# Patient Record
Sex: Female | Born: 1950 | Race: Black or African American | Hispanic: No | Marital: Single | Smoking: Never smoker
Health system: Southern US, Community
[De-identification: ages and names within clinical notes are randomized; demographics above are authoritative.]

## PROBLEM LIST (undated history)

## (undated) DIAGNOSIS — I82409 Acute embolism and thrombosis of unspecified deep veins of unspecified lower extremity: Secondary | ICD-10-CM

## (undated) DIAGNOSIS — I2699 Other pulmonary embolism without acute cor pulmonale: Secondary | ICD-10-CM

## (undated) DIAGNOSIS — Z21 Asymptomatic human immunodeficiency virus [HIV] infection status: Secondary | ICD-10-CM

## (undated) HISTORY — PX: ABDOMINAL HYSTERECTOMY: SHX81

## (undated) HISTORY — PX: SKIN BIOPSY: SHX1

## (undated) HISTORY — PX: HERNIA REPAIR: SHX51

## (undated) HISTORY — PX: KNEE SURGERY: SHX244

## (undated) HISTORY — PX: BREAST BIOPSY: SHX20

---

## 2008-09-09 ENCOUNTER — Inpatient Hospital Stay (HOSPITAL_COMMUNITY): Admission: EM | Admit: 2008-09-09 | Discharge: 2008-09-10 | Payer: Self-pay | Admitting: Internal Medicine

## 2008-09-09 ENCOUNTER — Ambulatory Visit: Payer: Self-pay | Admitting: Interventional Radiology

## 2008-09-09 ENCOUNTER — Encounter: Payer: Self-pay | Admitting: Emergency Medicine

## 2010-07-04 IMAGING — CT CT HEAD W/O CM
1 series · 16 of 30 positions shown, 20 images · non-contrast
Comparison: None

CLINICAL DATA: Weakness

CT HEAD WITHOUT CONTRAST
TECHNIQUE: Contiguous axial images were obtained from the base of
the skull through the vertex without contrast.

[Series 2: head 4.8 h37s · axial · 0.46mm/px · z∈[-144,-11]mm · 16 of 32 slices shown, 20 images]
[im 2/32  brain]
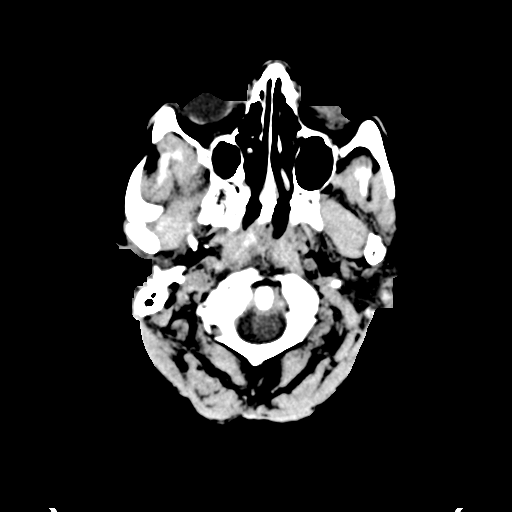
[im 2/32  bone]
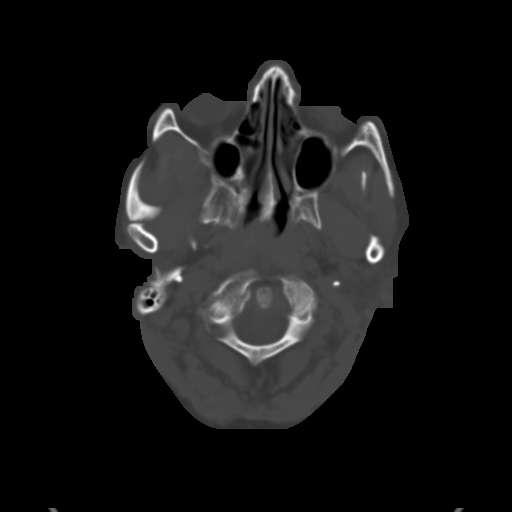
[im 4/32  brain]
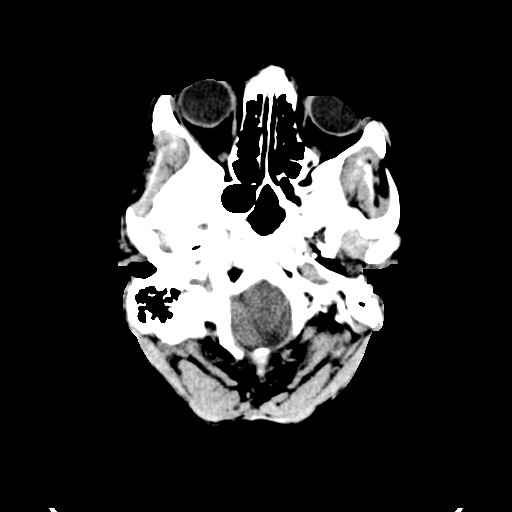
[im 6/32  brain]
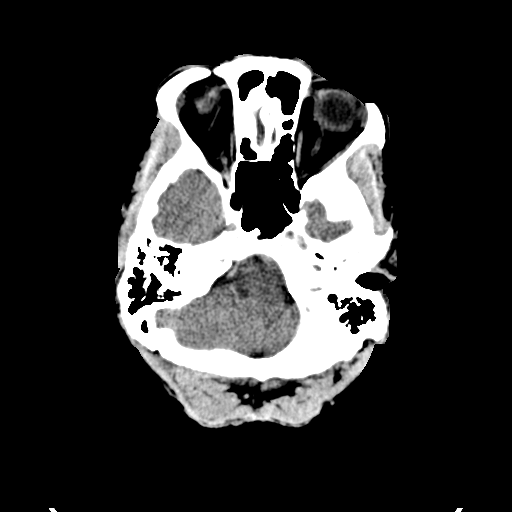
[im 8/32  brain]
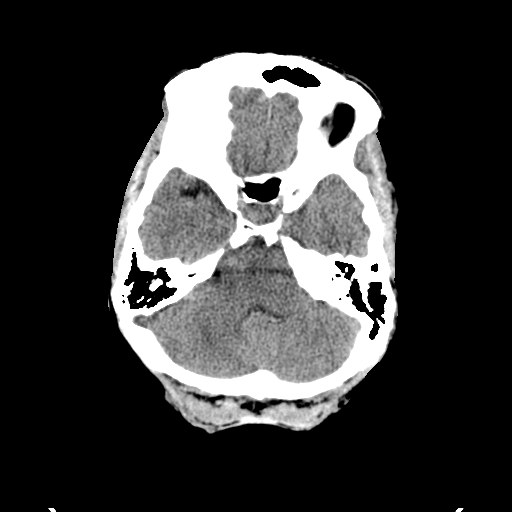
[im 9/32  brain]
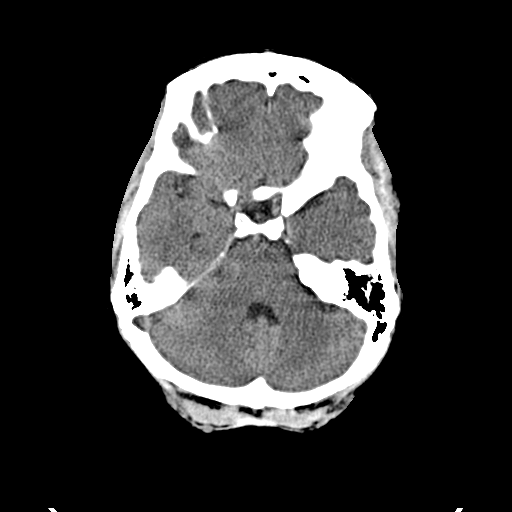
[im 9/32  bone]
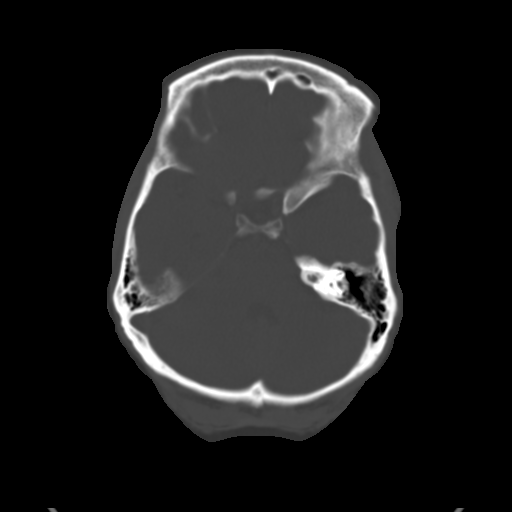
[im 11/32  brain]
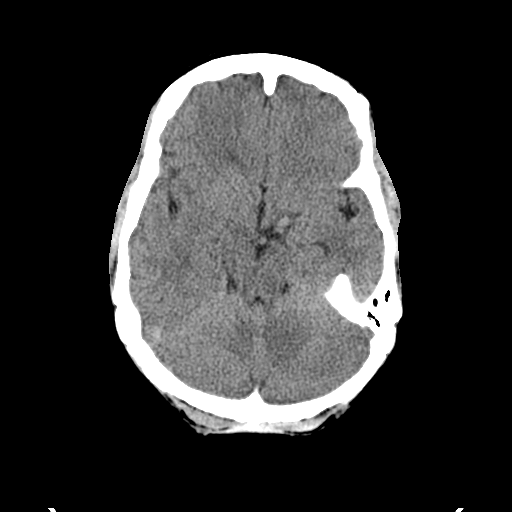
[im 13/32  brain]
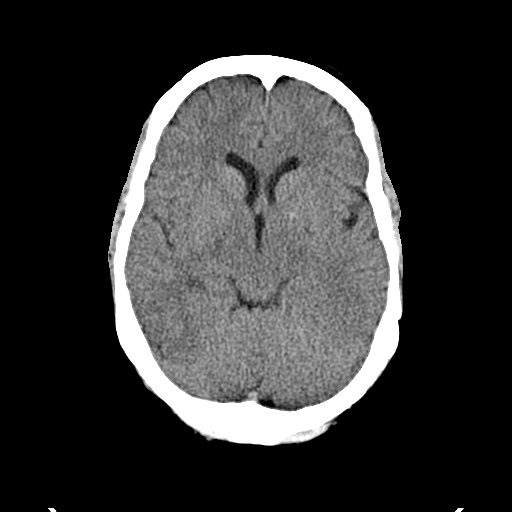
[im 15/32  brain]
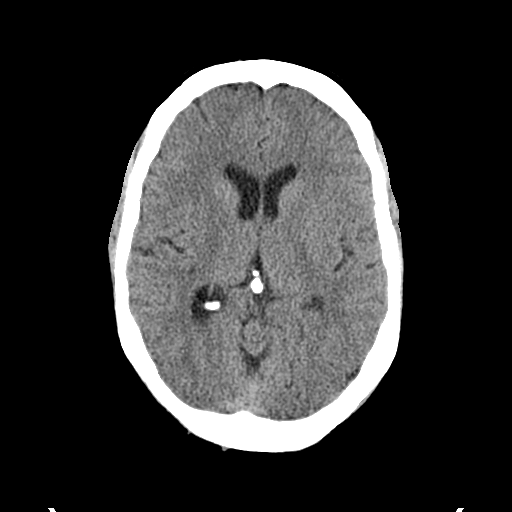
[im 17/32  brain]
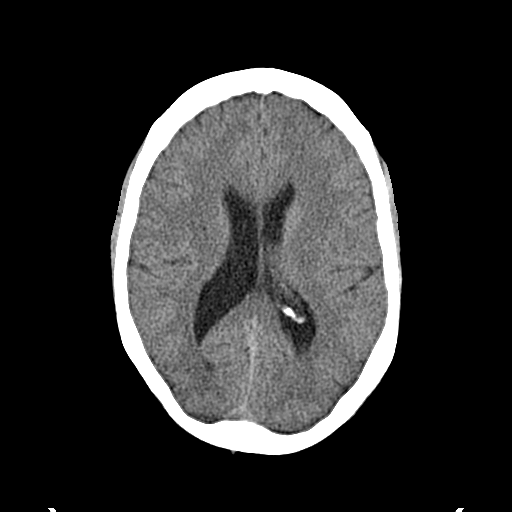
[im 17/32  bone]
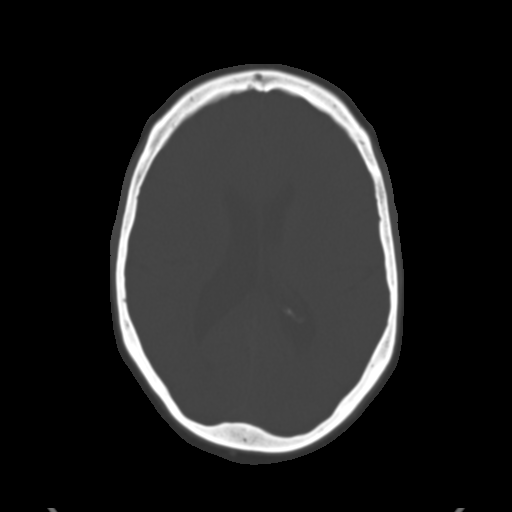
[im 19/32  brain]
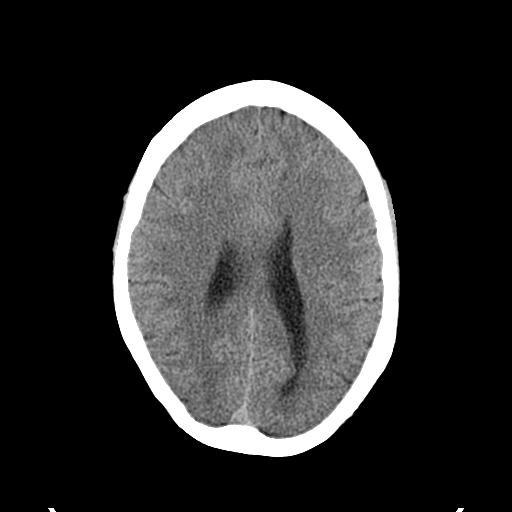
[im 21/32  brain]
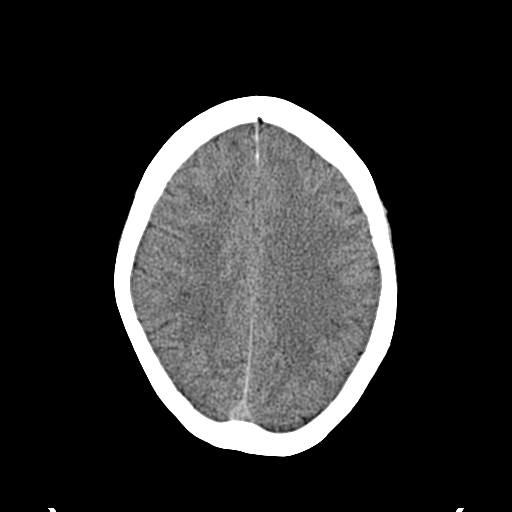
[im 23/32  brain]
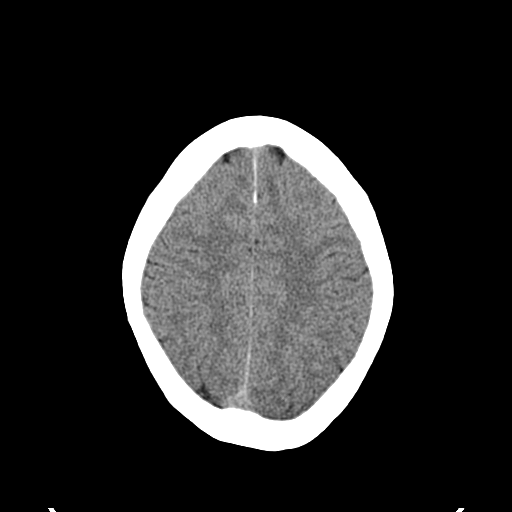
[im 24/32  brain]
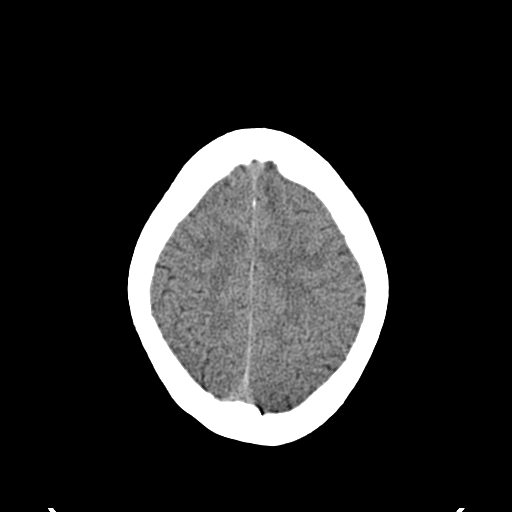
[im 24/32  bone]
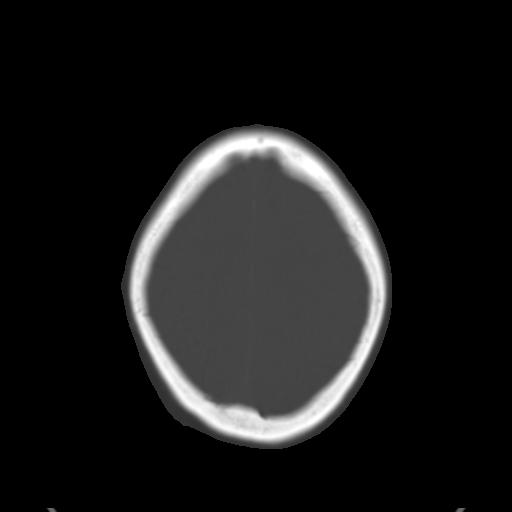
[im 26/32  brain]
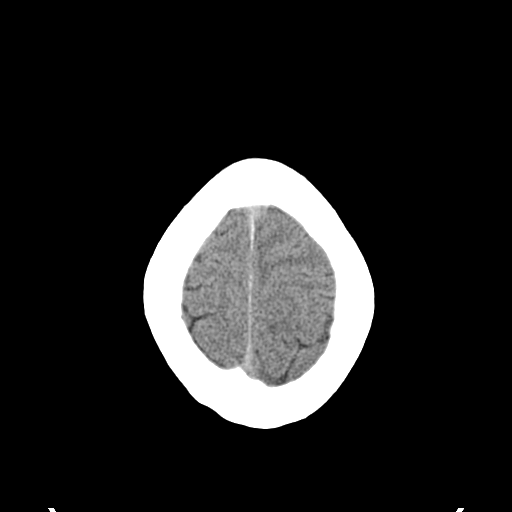
[im 28/32  brain]
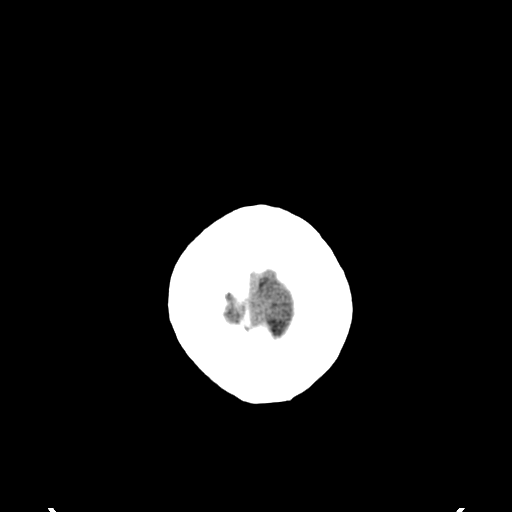
[im 30/32  brain]
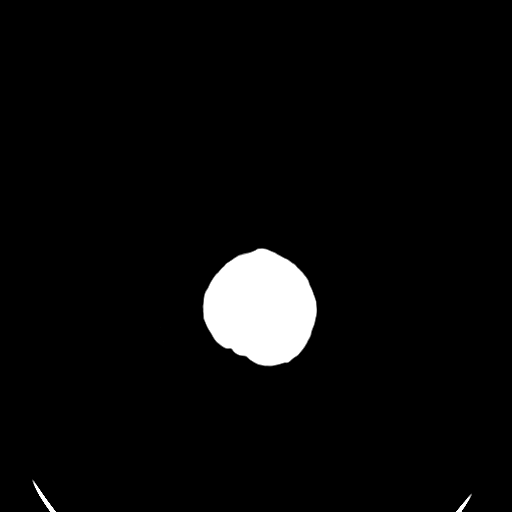

[16 of 30 positions shown; findings below may reference images not displayed]

FINDINGS: There is no mass effect, midline shift, or acute
intracranial hemorrhage.  Calcifications in the falx are noted
anteriorly.  The ventricular system and extraaxial space are
unremarkable.  Little if any chronic ischemic changes are noted in
the periventricular white matter.Mastoid air cells and visualized
paranasal sinuses are clear.
IMPRESSION: No acute intracranial pathology.

## 2010-09-03 LAB — CBC
HCT: 36.9 % (ref 36.0–46.0)
Hemoglobin: 12.8 g/dL (ref 12.0–15.0)
MCHC: 33.5 g/dL (ref 30.0–36.0)
MCHC: 33.6 g/dL (ref 30.0–36.0)
MCV: 92.2 fL (ref 78.0–100.0)
Platelets: 186 10*3/uL (ref 150–400)
RBC: 4.14 MIL/uL (ref 3.87–5.11)
RDW: 12.3 % (ref 11.5–15.5)
RDW: 13.4 % (ref 11.5–15.5)

## 2010-09-03 LAB — PHOSPHORUS: Phosphorus: 2.5 mg/dL (ref 2.3–4.6)

## 2010-09-03 LAB — BASIC METABOLIC PANEL
BUN: 5 mg/dL — ABNORMAL LOW (ref 6–23)
BUN: 7 mg/dL (ref 6–23)
CO2: 28 mEq/L (ref 19–32)
CO2: 33 mEq/L — ABNORMAL HIGH (ref 19–32)
Calcium: 8.9 mg/dL (ref 8.4–10.5)
Chloride: 107 mEq/L (ref 96–112)
Chloride: 109 mEq/L (ref 96–112)
Creatinine, Ser: 1 mg/dL (ref 0.4–1.2)
GFR calc Af Amer: >60 mL/min (ref >60)
GFR calc non Af Amer: 57 mL/min — ABNORMAL LOW (ref >60)
GFR calc non Af Amer: 58 mL/min — ABNORMAL LOW (ref >60)
GFR calc non Af Amer: >60 mL/min (ref >60)
Glucose, Bld: 120 mg/dL — ABNORMAL HIGH (ref 70–99)
Glucose, Bld: 123 mg/dL — ABNORMAL HIGH (ref 70–99)
Glucose, Bld: 80 mg/dL (ref 70–99)
Potassium: 3 mEq/L — ABNORMAL LOW (ref 3.5–5.1)
Potassium: 3.7 mEq/L (ref 3.5–5.1)
Sodium: 141 mEq/L (ref 135–145)
Sodium: 144 mEq/L (ref 135–145)

## 2010-09-03 LAB — POCT CARDIAC MARKERS
CKMB, poc: <1 ng/mL — ABNORMAL LOW (ref 1.0–8.0)
Myoglobin, poc: 39.3 ng/mL (ref 12–200)
Troponin i, poc: <0.05 ng/mL (ref 0.00–0.09)

## 2010-09-03 LAB — CARDIAC PANEL(CRET KIN+CKTOT+MB+TROPI)
CK, MB: 0.5 ng/mL (ref 0.3–4.0)
Total CK: 55 U/L (ref 7–177)
Total CK: 60 U/L (ref 7–177)

## 2010-09-03 LAB — DIFFERENTIAL
Basophils Absolute: 0 10*3/uL (ref 0.0–0.1)
Basophils Relative: 1 % (ref 0–1)
Lymphocytes Relative: 34 % (ref 12–46)
Monocytes Absolute: 0.3 10*3/uL (ref 0.1–1.0)
Neutro Abs: 2.8 10*3/uL (ref 1.7–7.7)
Neutrophils Relative %: 57 % (ref 43–77)

## 2010-10-07 NOTE — Discharge Summary (Signed)
NAME:  Marie Hopkins, Marie Hopkins            ACCOUNT NO.:  [id number]   MEDICAL RECORD NO.:  [id number]          PATIENT TYPE:  INP   LOCATION:  6741                         FACILITY:  [facility]   PHYSICIAN:  Peggye Pitt, M.D. DATE OF BIRTH:  10-Apr-1951   DATE OF ADMISSION:  09/09/2008  DATE OF DISCHARGE:  09/10/2008                               DISCHARGE SUMMARY   DISCHARGE DIAGNOSES:  1. Syncope, likely orthostasis versus vasovagal syncope.  2. Right lower lobe pneumonia.  3. Human immunodeficiency virus with a CD-4 count of 700 and a viral      load that is undetectable.  4. Hypokalemia, resolved.   DISCHARGE MEDICATIONS:  1. Avelox 400 mg daily for 7 days.  2. Lexiva 700 mg 2 tablets daily.  3. Norvir 100 mg daily.  4. Truvada 1 tablet daily.  5. Lasix 20 mg daily.  6. Premarin 1.25 mg daily.  7. Wellbutrin XL 300 mg daily.  8. Prilosec 20 mg daily.  9. Multivitamin 1 tablet daily.  10.Fosamax 70 mg weekly.   DISPOSITION AND FOLLOWUP:  Marie Hopkins is discharged home in stable  condition.  She has a pneumonia and will need to be on Avelox for a  total of 7 days.  I have recommended that she follow up with her primary  care physician in about 4-6 weeks.  At that time, a chest x-ray should  be done to follow up on the resolution of her pneumonia.   CONSULTATION THIS HOSPITALIZATION:  None.   IMAGES AND PROCEDURES PERFORMED DURING THIS HOSPITALIZATION:  1. Chest x-ray on September 09, 2008, that showed a right lower lobe      airspace disease.  2. CT scan of the head without contrast on September 09, 2008, that showed      no acute intracranial pathology.   HISTORY AND PHYSICAL EXAMINATION:  For full details, please refer to  dictation by Dr. Daiva Eves on September 09, 2008, but in brief, Marie Hopkins  is a very pleasant 60 year old African American woman who has a past  medical history significant for HIV that was diagnosed in 1998 with a  most recent CD-4 count of about 700.  She has  been maintained on HAART  therapy since diagnosis.  She stated that on day of admission, she was  at church, stood up, and began singing, and after a few minutes felt  like something was not right.  She denied any dizziness or  lightheadedness but sat down.  However, a few minutes later when she got  up to leave the church, she felt lightheaded, dizzy, and passed out  having been caught by her partner.  She was then taken to the [facility] and then transferred to [facility] for further evaluation and  management.  Of note upon admission, she was found to be hypokalemic  with a potassium of 2.9.   HOSPITAL COURSE BY ACTIVE PROBLEM:  1. Syncopal event:  Initial scan of the head was negative.  Given that      she had no focal neurologic findings, I believe that an MRI is not  necessary at this point.  I believe that this episode of syncope      was likely orthostatic or vasovagal.  Despite the fact that she was      not orthostatic by vital signs and fact that she becomes dizzy upon      standing, makes me more incline to this diagnosis.  She has had a      negative cardiac workup including 2 sets of negative cardiac      enzymes and an EKG that shows no acute ST-T wave changes.  There is      also a possibility that the syncopal event may have been      precipitated by her pneumonia and some slight dehydration because      of that.  2. Right lower lobe pneumonia:  A culture data is still pending at the      time of this dictation.  She will be discharged on Avelox 400 mg      daily for a total of 7 days.  3. Human immunodeficiency virus:  She is to continue her HAART      therapy.  4. Her hypokalemia has been repleted.  Her magnesium level was normal.  5. Rest of chronic medical issues have not been a problem on this      hospitalization.   VITAL SIGNS UPON DISCHARGE:  Blood pressure 105/68, heart rate 61,  respirations 18, O2 sats 97% on room air with a temperature of  97.9.   LABORATORY DATA ON DAY OF DISCHARGE:  Sodium 141, potassium 3.7,  chloride 109, bicarb 28, BUN 5, creatinine 0.99 with a glucose of 123.  WBCs 4.6, hemoglobin 12.4, and a platelet count of 186.      Peggye Pitt, M.D.  Electronically Signed     EH/MEDQ  D:  09/10/2008  T:  09/11/2008  Job:  [id number]

## 2010-10-07 NOTE — H&P (Signed)
NAME:  Marie Hopkins, TRIMARCO NO.:  [id number]   MEDICAL RECORD NO.:  [id number]          PATIENT TYPE:  INP   LOCATION:  6741                         FACILITY:  [facility]   PHYSICIAN:  Acey Lav, MD  DATE OF BIRTH:  1950/10/01   DATE OF ADMISSION:  09/09/2008  DATE OF DISCHARGE:                              HISTORY & PHYSICAL   .   CHIEF COMPLAINT:  Syncopal episode.   HISTORY OF PRESENT ILLNESS:  Ms. Gatling is a 60 year old African  American lady with a past medical history significant for HIV diagnosed  in 1998.  At that point in time she had a low CD-4 count of 5 but was  asymptomatic.  She was started on antiretroviral therapy and has done  quite well.  She currently states that her viral load has been  undetectable on her regimen of 2 tablets of Lexiva with 1 tablet of  Norvir and 1 tablet of Truvada daily.  She states her most recent CD-4  count was 700.  This was checked in the latter part of 2009.  She is due  for a new appointment in June of 2010 with Leonard Schwartz over at [facility].  The patient states that she was in church this morning when she  stood up and began singing and after a few lines of singing felt malaise  and felt that something was not right.  She denied dizziness or  lightheadedness at that point in time but sat down.  She continued to  feel uneasy and a few minutes later when she got up to leave the church  she felt lightheaded, dizzy and passed out having been caught by her  congregation members.  Her partner drove her to [facility]  At [facility] she was found to have an  infiltrate in the right lower lobe.  She was also found to be  hypokalemic and dizzy with sitting up.  Her EKG there showed normal  sinus rhythm with first-degree AV block but no acute ST or T-wave  changes.  She was hypokalemic with potassium of 2.9.  her cardiac  markers were negative initially and now rechecked 2 hours later.   Her  CBC was relatively unremarkable.  She was given a dose of Avelox, some  potassium replacement and some intravenous fluids.  Arrangements were  made for her to be transferred to [facility].   PAST MEDICAL HISTORY:  1. HIV diagnosed in 1991 as described above, well-controlled.  2. History of lower extremity edema for which she was prescribed Lasix      approximately a year ago.   PAST SURGICAL HISTORY:  1. Lumpectomy in the past.  2. Bilateral inguinal herniorrhaphy.  3. Total abdominal hysterectomy.   CURRENT MEDICATIONS:  1. Lexiva 700 mg, take 2 tablets once daily with Norvir 100 mg daily      and 1 tablet Truvada daily.  2. Lasix 25 daily.  3. Premarin 1.25 mg daily.  4. Wellbutrin 300 mg XL once daily.  5. Prilosec 20 mg daily,  6. Fosamax 70 mg  once weekly.   ALLERGIES:  THE PATIENT STATES SHE IS ALLERGIC TO SULFA AND ALSO  ALLERGIC TO PENICILLIN WHICH CAUSES A RASH.  She thinks she has taken  Keflex after having had her penicillin allergy, but she is not sure.   SOCIAL HISTORY:  She is a nonsmoker, nondrinker.  The lives with her  with her partner.   FAMILY HISTORY:  The patient has no history of early coronary disease.  She does have a sister who had a stroke in her 63s.   REVIEW OF SYSTEMS:  A described above in history of present illness.  Otherwise she has had a cough yesterday and today but this has been  nonproductive.  She had not had dysuria, hematuria.  She has had no  nausea, vomiting, chills, fevers.  She  does feel, cold at baseline  but I am cold natured.  She has not eaten any less than usual and she  states that she is a at times a picky eater and will fluctuate in how  much she eats.  She denies abdominal pain, diarrhea, blood per rectum.  Otherwise 10-point review of systems is negative.   PHYSICAL EXAMINATION:  Blood pressure here at the 6700 floor systolic  137/diastolic 80, pulse 66, temperature 97.7, respirations 18, pulse   oximetry 90% on room air.  GENERAL:  Quite pleasant lady in no acute distress.  HEENT:  Normocephalic, atraumatic.  Pupils equal, round and reactive to  light.  Sclerae anicteric.  Oropharynx clear.  CARDIOVASCULAR EXAM:  Regular rate and rhythm.  No murmurs, gallops or  rub.  LUNGS:  Clear to auscultation bilaterally without wheezing, rhonchi or  rales.  No egophony.  No dullness to percussion.  ABDOMEN:  Soft, nondistended, nontender.  No evidence of organomegaly.  EXTREMITIES:  Without edema.   Chest x-ray and also noncontrasted CT of the head which showed no acute  intracranial pathology.  Chest x-ray showed right lower lobe infiltrate.   LABORATORIES:  EKG as described.  Cardiac markers negative.  BMET shows  sodium of 144, potassium 2.9, bicarb 33, chloride +102, BUN 12,  creatinine 1, calcium 8.9.  CBC:  White count of 4.9, hemoglobin 12.8,  hematocrit 33.5, platelets 185.   ASSESSMENT/PLAN:  This is a 60 year old African American lady with past  medical history significant for HIV, well-controlled on her current  regimen of Lexiva, Norvir and Truvada, who presents with a syncopal  episode that occurred at church and also has a right lower lobe  infiltrate.  1. Syncope:  First episode the patient has ever had.  Differential      includes emotional stress although she has not had this before, did      not feel particularly any strong emotional tones at the church when      she was singing.  Certainly orthostasis is high on the differential      given that currently on exam feels dizzy when I sit her up to      examine her lungs.  Her blood pressure is not low but she needs to      have orthostatic vital signs checked.  Certainly cardiac event is      possible as well.  We are going to check her orthostatic vital      signs.  We will give her intravenous fluids.  We will cycle cardiac      enzymes and follow her on telemetry, recheck electrocardiogram in      the morning.  We  can consider further workup at that time.  I do      not think she has evidence of exertional syncope.  We could      consider a two-dimensional echocardiogram.  We could consider other      things such as Holter monitors but I want to see what her initial      workup reveals.  Other tests such as vertebral artery      insufficiency, which can be checked by transcranial Dopplers, is a      possibility, but I doubt this as being a cause for her syncope.  2. Right lower lobe infiltrate.  I am going to give  her Avelox and      continue this for 6 days.  3. Hypokalemia likely due to the Lasix.  I am unsure of what her      baseline is.  Do not hear much in terms of nausea, vomiting or      other reasons for be hypokalemic at this point time.  We will      replete her potassium and give her magnesium.  We will recheck her      metabolic panel right now.  4. HIV: continue lexiva, norvir and truvada, no need to check vl or      cd4 at present  5. Prophylaxis.  The patient will be  on heparin t.i.d.  6. Postmenopausal symptoms.  We will continue her on her Premarin.  7. Osteoporosis.  Continue her on her Fosamax.  8. Gastroesophageal reflux disease.  Continue her on her proton pump      inhibitor.  9. Code status.  The patient is full code      Acey Lav, MD  Electronically Signed     CV/MEDQ  D:  09/09/2008  T:  09/09/2008  Job:  [id number]   cc:   Leonard Schwartz, P.A.  Joselyn Arrow, Dr.

## 2016-06-15 ENCOUNTER — Emergency Department (HOSPITAL_BASED_OUTPATIENT_CLINIC_OR_DEPARTMENT_OTHER)
Admission: EM | Admit: 2016-06-15 | Discharge: 2016-06-15 | Disposition: A | Discharge status: Home / Self Care | Payer: Medicare HMO | Attending: Emergency Medicine | Admitting: Emergency Medicine

## 2016-06-15 ENCOUNTER — Emergency Department (HOSPITAL_BASED_OUTPATIENT_CLINIC_OR_DEPARTMENT_OTHER): Payer: Medicare HMO

## 2016-06-15 ENCOUNTER — Encounter (HOSPITAL_BASED_OUTPATIENT_CLINIC_OR_DEPARTMENT_OTHER): Payer: Self-pay | Admitting: Emergency Medicine

## 2016-06-15 DIAGNOSIS — R05 Cough: Secondary | ICD-10-CM | POA: Diagnosis present

## 2016-06-15 DIAGNOSIS — Z79899 Other long term (current) drug therapy: Secondary | ICD-10-CM | POA: Insufficient documentation

## 2016-06-15 DIAGNOSIS — Z21 Asymptomatic human immunodeficiency virus [HIV] infection status: Secondary | ICD-10-CM | POA: Diagnosis not present

## 2016-06-15 DIAGNOSIS — J069 Acute upper respiratory infection, unspecified: Secondary | ICD-10-CM

## 2016-06-15 DIAGNOSIS — J0110 Acute frontal sinusitis, unspecified: Secondary | ICD-10-CM

## 2016-06-15 DIAGNOSIS — B9789 Other viral agents as the cause of diseases classified elsewhere: Secondary | ICD-10-CM

## 2016-06-15 DIAGNOSIS — R110 Nausea: Secondary | ICD-10-CM | POA: Insufficient documentation

## 2016-06-15 HISTORY — DX: Asymptomatic human immunodeficiency virus (hiv) infection status: Z21

## 2016-06-15 MED ORDER — AZITHROMYCIN 250 MG PO TABS: 250.0000 mg | ORAL_TABLET | Freq: Every day | ORAL | 0 refills | Status: DC

## 2016-06-15 MED ORDER — FLUTICASONE PROPIONATE 50 MCG/ACT NA SUSP: 1.0000 | Freq: Two times a day (BID) | NASAL | 1 refills | Status: AC

## 2016-06-15 MED ORDER — OXYMETAZOLINE HCL 0.05 % NA SOLN
1.0000 | Freq: Once | NASAL | Status: AC
  Administered 2016-06-15: 1 via NASAL
  Filled 2016-06-15: qty 15

## 2016-06-15 MED ORDER — HYDROCODONE-HOMATROPINE 5-1.5 MG/5ML PO SYRP: 5.0000 mL | ORAL_SOLUTION | Freq: Four times a day (QID) | ORAL | 0 refills | Status: DC | PRN

## 2016-06-15 NOTE — ED Notes (Signed)
ED Provider at bedside. 

## 2016-06-15 NOTE — Discharge Instructions (Signed)
Take the Afrin twice a day for 3 days only.  Make sure you wait about 30 min before using the flonase.  Use the flonase about 20 days before stopping.

## 2016-06-15 NOTE — ED Triage Notes (Signed)
Pt via [facility] with cough and dx of bronchitis 4 days prior. Reports she is not feeling any better with medications given. Vitals stable at triage.

## 2016-06-15 NOTE — ED Provider Notes (Signed)
[facility] Provider Note   CSN: [id number] Arrival date & time: 06/15/16  1408  By signing my name below, I, Vista Minkobert Ross, attest that this documentation has been prepared under the direction and in the presence of Gwyneth SproutWhitney Laster Appling, MD. Electronically signed, Vista Minkobert Ross, ED Scribe. 06/15/16. 4:09 PM.  History   Chief Complaint Chief Complaint  Patient presents with  . Cough    HPI HPI Comments: Marie NgoCarrie Hopkins is a 66 y.o. female, with Hx of HIV, brought in by ambulance, who presents to the Emergency Department complaining of persistent cough, congestion, dyspnea on exertion that started approximately two weeks ago. She states that her symptoms started as a mild central chest pain that has subsided. She also had right sided ear pain that has improved. Pt also notes productive cough with mucous production and post nasal drip. She saw her PCP four days ago for these symptoms and was dx with bronchitis, she was prescribed an inhaler and singular which she has used with no significant relief. She was instructed to use two puffs tid. She has not had any wheezing.  She has Hx of HIV dx approximately 20 years ago and states that her viral load counts are all normal and is compliant with her medications. Pt also notes a sharp, intermittent, mild abdominal pain that is worse when she coughs. She also notes an intermittent low grade fever with associated chills since her symptoms started, no fever currently. She has not used any nasal sprays. No vomiting, diarrhea. She denies any urinary symptoms.  The history is provided by the patient. No language interpreter was used.    Past Medical History:  Diagnosis Date  . HIV test positive (HCC)     There are no active problems to display for this patient.   Past Surgical History:  Procedure Laterality Date  . ABDOMINAL HYSTERECTOMY    . BACK SURGERY    . COSMETIC SURGERY    . FRACTURE SURGERY    . HERNIA REPAIR    . SKIN BIOPSY       OB History    No data available       Home Medications    Prior to Admission medications   Not on File    Family History No family history on file.  Social History Social History  Substance Use Topics  . Smoking status: Never Smoker  . Smokeless tobacco: Never Used  . Alcohol use No     Allergies   Dipth, acell pertus, [tetanus-diphth-acell pertussis]; Penicillins; and Sulfa antibiotics   Review of Systems Review of Systems  Constitutional: Positive for chills and fever (low grade, resolved).  HENT: Positive for congestion, ear pain (right) and postnasal drip.   Respiratory: Positive for cough.   Gastrointestinal: Positive for nausea. Negative for diarrhea and vomiting.  Genitourinary: Negative for decreased urine volume, dysuria and hematuria.  All other systems reviewed and are negative.    Physical Exam Updated Vital Signs BP 141/81   Pulse 67   Temp 98.3 F (36.8 C) (Oral)   Resp 21   Ht 5\' 7"  (1.702 m)   Wt 186 lb (84.4 kg)   SpO2 98%   BMI 29.13 kg/m   Physical Exam  Constitutional: She appears well-developed and well-nourished. No distress.  HENT:  Head: Normocephalic and atraumatic.  Right Ear: External ear normal.  Left Ear: External ear normal.  Swollen nasal turbulence bilaterally. Mild sinus tenderness to palpation. Right middle ear effusion without erythema. Mild pharyngeal erythema with  cobblestoning.  Eyes: Conjunctivae are normal. Right eye exhibits no discharge. Left eye exhibits no discharge. No scleral icterus.  Neck: Neck supple. No tracheal deviation present.  Cardiovascular: Normal rate, regular rhythm and intact distal pulses.   Pulmonary/Chest: Effort normal and breath sounds normal. No stridor. No respiratory distress. She has no wheezes. She has no rales.  Abdominal: Soft. Bowel sounds are normal. She exhibits no distension. There is no tenderness. There is no rebound and no guarding.  Musculoskeletal: She exhibits no  edema or tenderness.  Neurological: She is alert. She has normal strength. No cranial nerve deficit (no facial droop, extraocular movements intact, no slurred speech) or sensory deficit. She exhibits normal muscle tone. She displays no seizure activity. Coordination normal.  Skin: Skin is warm and dry. No rash noted.  Psychiatric: She has a normal mood and affect.  Nursing note and vitals reviewed.   ED Treatments / Results  DIAGNOSTIC STUDIES: Oxygen Saturation is 98% on RA, normal by my interpretation.   COORDINATION OF CARE: 4:08 PM-Discussed treatment plan with pt at bedside and pt agreed to plan.   Labs (all labs ordered are listed, but only abnormal results are displayed) Labs Reviewed - No data to display  EKG  EKG Interpretation None       Radiology Dg Chest 2 View  Result Date: 06/15/2016 CLINICAL DATA:  Cough. EXAM: CHEST  2 VIEW COMPARISON:  Radiographs of September 09, 2008. FINDINGS: The heart size and mediastinal contours are within normal limits. Both lungs are clear. No pneumothorax or pleural effusion is noted. The visualized skeletal structures are unremarkable. IMPRESSION: No active cardiopulmonary disease. Electronically Signed   By: Lupita Raider, M.D.   On: 06/15/2016 14:53    Procedures Procedures (including critical care time)  Medications Ordered in ED Medications - No data to display   Initial Impression / Assessment and Plan / ED Course  I have reviewed the triage vital signs and the nursing notes.  Pertinent labs & imaging results that were available during my care of the patient were reviewed by me and considered in my medical decision making (see chart for details).     Patient is a 66 year old female with HIV currently on medication with undetectable viral load presenting today with symptoms of bronchitis and sinusitis. She has been using an inhaler when necessary and Singulair without significant improvement in her symptoms. She has had  chills but no documented fever. She does feel winded with exertion but is breathing comfortably and speaking in full sentences. She has no wheezing on exam. She does have swollen nasal turbinates and fluid behind her right TM. She does have some mild muscular tenderness in the lower chest and abdomen but relates that to recurrent cough tubes of been going on for 2 weeks will treat with azithromycin, Flonase and cough medication. Looking at multiple medication checkers there does not appear to be an interaction between azithromycin and genvoya.  This was discussed with the pt.  she was also encouraged follow-up with her PCP later this week for recheck.  Chest x-ray today is within normal limits.  Final Clinical Impressions(s) / ED Diagnoses   Final diagnoses:  Viral URI with cough  Acute non-recurrent frontal sinusitis    New Prescriptions Discharge Medication List as of 06/15/2016  4:16 PM    START taking these medications   Details  azithromycin (ZITHROMAX) 250 MG tablet Take 1 tablet (250 mg total) by mouth daily. Take first 2 tablets together, then 1  every day until finished., Starting Mon 06/15/2016, Print    fluticasone (FLONASE) 50 MCG/ACT nasal spray Place 1 spray into both nostrils 2 (two) times daily., Starting Mon 06/15/2016, Print    HYDROcodone-homatropine (HYCODAN) 5-1.5 MG/5ML syrup Take 5 mLs by mouth every 6 (six) hours as needed for cough., Starting Mon 06/15/2016, Print       I personally performed the services described in this documentation, which was scribed in my presence.  The recorded information has been reviewed and considered.     Gwyneth Sprout, MD 06/15/16 [facility]

## 2017-06-16 ENCOUNTER — Other Ambulatory Visit: Payer: Self-pay

## 2017-06-16 ENCOUNTER — Emergency Department (HOSPITAL_BASED_OUTPATIENT_CLINIC_OR_DEPARTMENT_OTHER): Payer: Medicare HMO

## 2017-06-16 ENCOUNTER — Encounter (HOSPITAL_BASED_OUTPATIENT_CLINIC_OR_DEPARTMENT_OTHER): Payer: Self-pay

## 2017-06-16 ENCOUNTER — Emergency Department (HOSPITAL_BASED_OUTPATIENT_CLINIC_OR_DEPARTMENT_OTHER)
Admission: EM | Admit: 2017-06-16 | Discharge: 2017-06-16 | Disposition: A | Discharge status: Home / Self Care | Payer: Medicare HMO | Attending: Physician Assistant | Admitting: Physician Assistant

## 2017-06-16 DIAGNOSIS — Z79899 Other long term (current) drug therapy: Secondary | ICD-10-CM | POA: Insufficient documentation

## 2017-06-16 DIAGNOSIS — M79604 Pain in right leg: Secondary | ICD-10-CM | POA: Insufficient documentation

## 2017-06-16 HISTORY — DX: Acute embolism and thrombosis of unspecified deep veins of unspecified lower extremity: I82.409

## 2017-06-16 HISTORY — DX: Other pulmonary embolism without acute cor pulmonale: I26.99

## 2017-06-16 NOTE — ED Triage Notes (Signed)
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

## 2017-06-16 NOTE — ED Notes (Signed)
Patient transported to Ultrasound 

## 2017-06-16 NOTE — Discharge Instructions (Signed)
We happy to report that your DVT study was negative.  Please follow-up with your primary care provider.

## 2017-06-16 NOTE — ED Provider Notes (Signed)
[facility] EMERGENCY DEPARTMENT Provider Note   CSN: [id number] Arrival date & time: 06/16/17  2039     History   Chief Complaint Chief Complaint  Patient presents with  . Leg Pain    HPI Marie Hopkins is a 67 y.o. female.  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  Patient was on Eliquis from May through December.  Recently taken off Eliquis.  Now presenting with pain and swelling in the right lower extremity.  Patient had no provoking factors no recent long trips, immobilization.  Past Medical History:  Diagnosis Date  . DVT (deep venous thrombosis) (HCC)   . 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 Pulmonary emboli (HCC)     There are no active problems to display for this patient.   Past Surgical History:  Procedure Laterality Date  . ABDOMINAL HYSTERECTOMY    . BREAST BIOPSY    . HERNIA REPAIR    . KNEE SURGERY    . SKIN BIOPSY      OB History    No data available       Home Medications    Prior to Admission medications   Medication Sig Start Date End Date Taking? Authorizing Provider  Montelukast Sodium (SINGULAIR PO) Take by mouth.   Yes [provider]  Elviteg-Cobic-Emtricit-TenofAF (GENVOYA PO) Take by mouth.    [provider]  fluticasone (FLONASE) 50 MCG/ACT nasal spray Place 1 spray into both nostrils 2 (two) times daily. 06/15/16   Gwyneth Sprout, MD    Family History No family history on file.  Social History Social History   Tobacco Use  . Smoking status: Never Smoker  . Smokeless tobacco: Never Used  Substance Use Topics  . Alcohol use: No  . Drug use: No     Allergies   Dipth, acell pertus, [tetanus-diphth-acell pertussis]; Penicillins; and Sulfa antibiotics   Review of Systems Review of Systems  Constitutional: Negative for activity change.  Respiratory: Negative for shortness of breath.   Cardiovascular: Positive for  leg swelling. Negative for chest pain.  Gastrointestinal: Negative for abdominal pain.     Physical Exam Updated Vital Signs BP 134/72 (BP Location: Left Arm)   Pulse 81   Temp 97.9 F (36.6 C) (Oral)   Resp 18   Ht 5\' 7"  (1.702 m)   Wt 86.7 kg (191 lb 2.2 oz)   SpO2 97%   BMI 29.94 kg/m   Physical Exam  Constitutional: She is oriented to person, place, and time. She appears well-developed and well-nourished.  HENT:  Head: Normocephalic and atraumatic.  Eyes: Right eye exhibits no discharge. Left eye exhibits no discharge.  Cardiovascular: Normal rate, regular rhythm and normal heart sounds.  No murmur heard. Pulmonary/Chest: Effort normal and breath sounds normal. She has no wheezes. She has no rales.  Abdominal: Soft. She exhibits no distension. There is no tenderness.  Musculoskeletal:  Mild swelling on right lower extremity more than left.  Positive pulses sensation strength intact.  Neurological: She is oriented to person, place, and time.  Skin: Skin is warm and dry. She is not diaphoretic.  Psychiatric: She has a normal mood and affect.  Nursing note and vitals reviewed.    ED Treatments / Results  Labs (all labs ordered are listed, but only abnormal results are displayed) Labs Reviewed - No data to display  EKG  EKG Interpretation None  Radiology [place] Venous Img Lower Unilateral Right  Result Date: 06/16/2017 CLINICAL DATA:  Right lower extremity pain and edema for the past 3 days. History of DVT. Evaluate for acute or chronic DVT. EXAM: RIGHT LOWER EXTREMITY VENOUS DOPPLER ULTRASOUND TECHNIQUE: Gray-scale sonography with graded compression, as well as color Doppler and duplex ultrasound were performed to evaluate the lower extremity deep venous systems from the level of the common femoral vein and including the common femoral, femoral, profunda femoral, popliteal and calf veins including the posterior tibial, peroneal and gastrocnemius veins when  visible. The superficial great saphenous vein was also interrogated. Spectral Doppler was utilized to evaluate flow at rest and with distal augmentation maneuvers in the common femoral, femoral and popliteal veins. COMPARISON:  None. FINDINGS: Contralateral Common Femoral Vein: Respiratory phasicity is normal and symmetric with the symptomatic side. No evidence of thrombus. Normal compressibility. Common Femoral Vein: No evidence of thrombus. Normal compressibility, respiratory phasicity and response to augmentation. Saphenofemoral Junction: No evidence of thrombus. Normal compressibility and flow on color Doppler imaging. Profunda Femoral Vein: No evidence of thrombus. Normal compressibility and flow on color Doppler imaging. Femoral Vein: No evidence of thrombus. Normal compressibility, respiratory phasicity and response to augmentation. Popliteal Vein: No evidence of thrombus. Normal compressibility, respiratory phasicity and response to augmentation. Calf Veins: No evidence of thrombus. Normal compressibility and flow on color Doppler imaging. Superficial Great Saphenous Vein: No evidence of thrombus. Normal compressibility. Venous Reflux:  None. Other Findings:  None. IMPRESSION: No evidence of acute or chronic DVT within the right lower extremity. Electronically Signed   By: Simonne ComeJohn  Watts M.D.   On: 06/16/2017 22:14    Procedures Procedures (including critical care time)  Medications Ordered in ED Medications - No data to display   Initial Impression / Assessment and Plan / ED Course  I have reviewed the triage vital signs and the nursing notes.  Pertinent labs & imaging results that were available during my care of the patient were reviewed by me and considered in my medical decision making (see chart for details).      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  Patient was on Eliquis from May through December.   Recently taken off Eliquis.  Now presenting with pain and swelling in the right lower extremity.  Patient had no provoking factors no recent long trips, immobilization.  10:39 PM Will get ultrasound of right lower extremity.  DVT study negative. Will discharge.    Final Clinical Impressions(s) / ED Diagnoses   Final diagnoses:  None    ED Discharge Orders    None       Abelino DerrickMackuen, Coda Filler Lyn, MD 06/16/17 2239

## 2019-01-16 IMAGING — US US EXTREM LOW VENOUS*R*
1 series · 13 of 24 positions shown · non-contrast
Comparison: None.

CLINICAL DATA: Right lower extremity pain and edema for the past 3
days. History of DVT. Evaluate for acute or chronic DVT.



[Series 1: us extrem low venous*right* · 0.08mm/px · 13 of 28 slices shown]
[im 1/28]
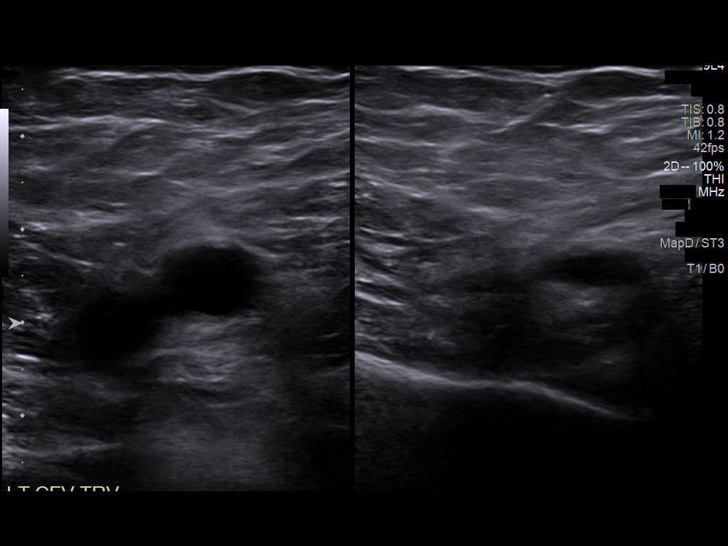
[im 3/28]
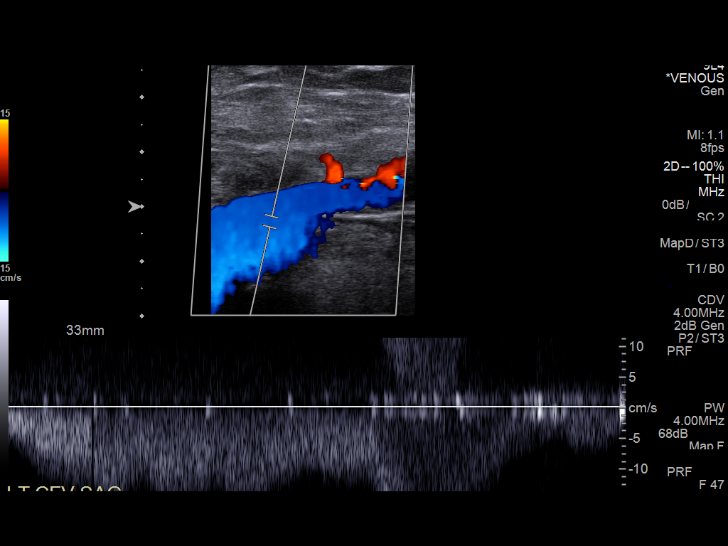
[im 5/28]
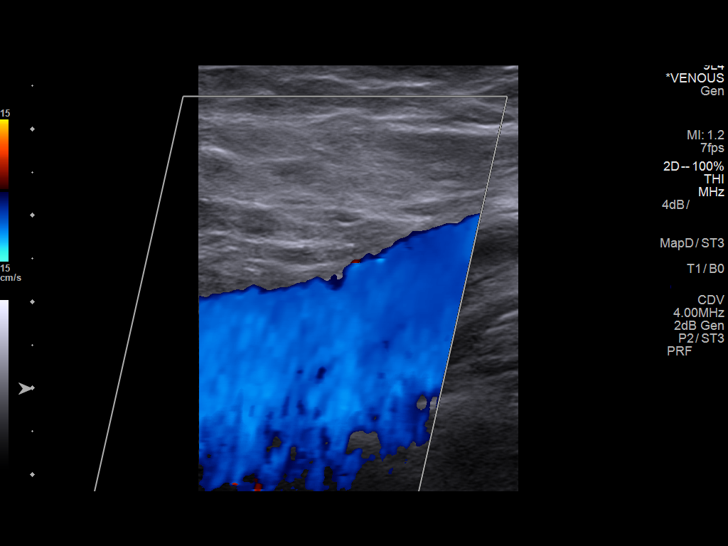
[im 8/28]
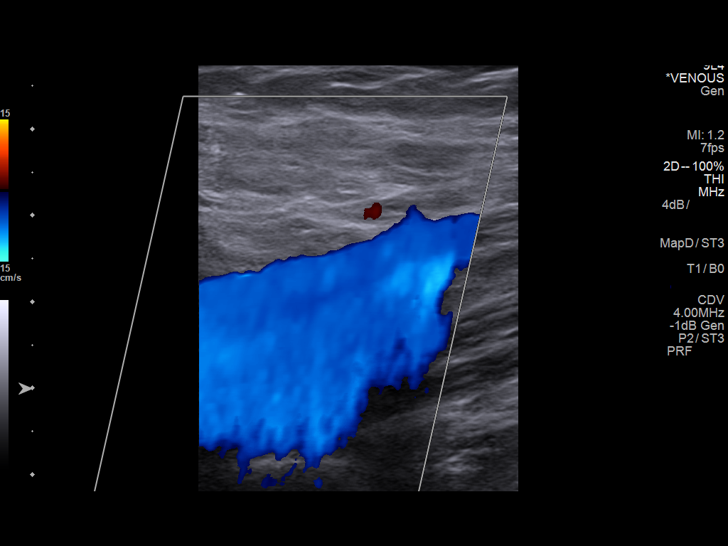
[im 10/28]
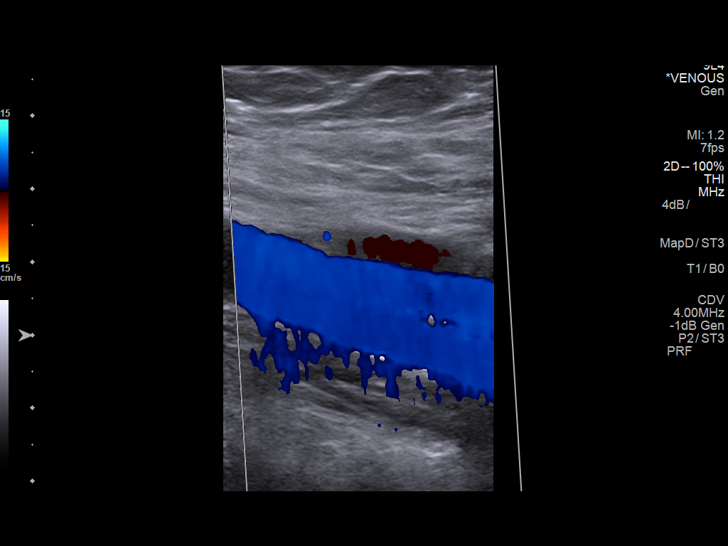
[im 12/28]
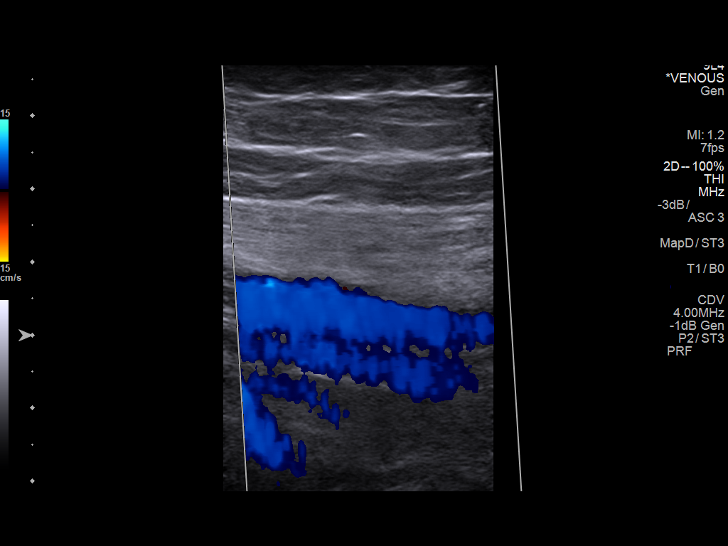
[im 15/28]
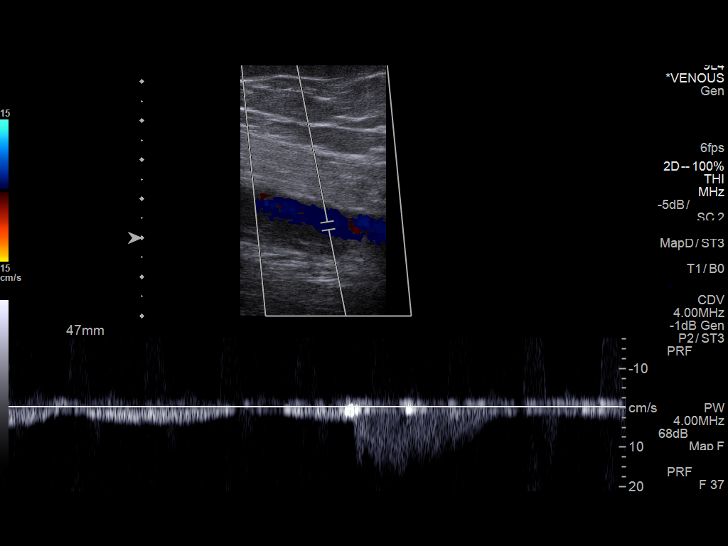
[im 16/28]
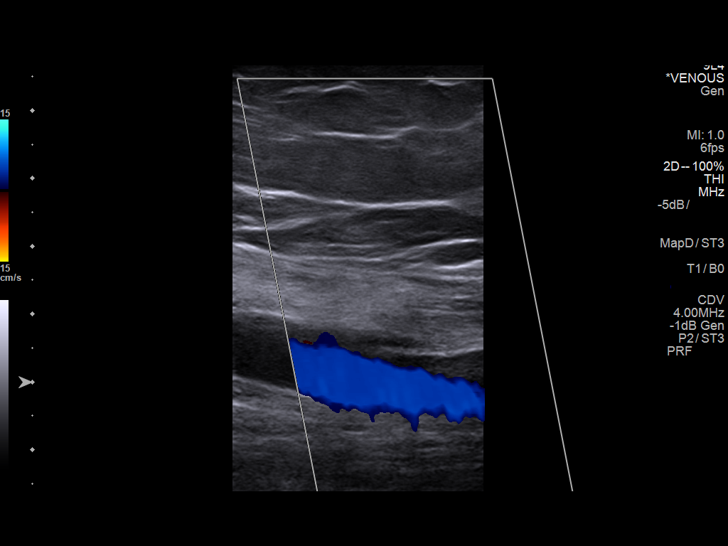
[im 18/28]
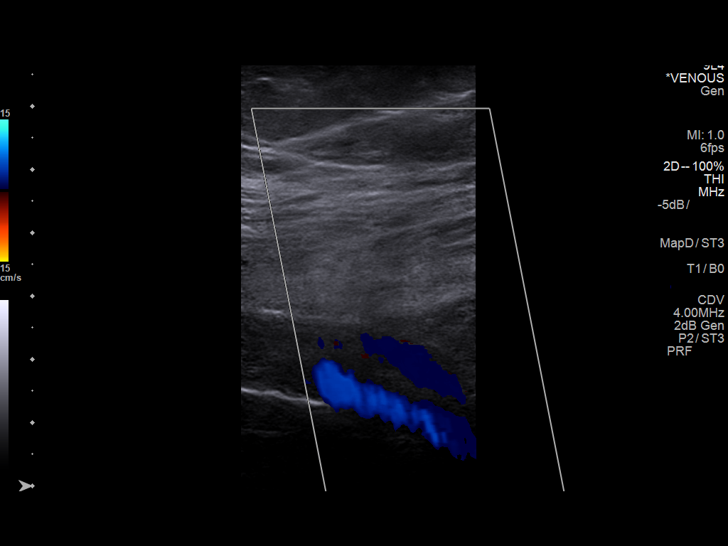
[im 20/28]
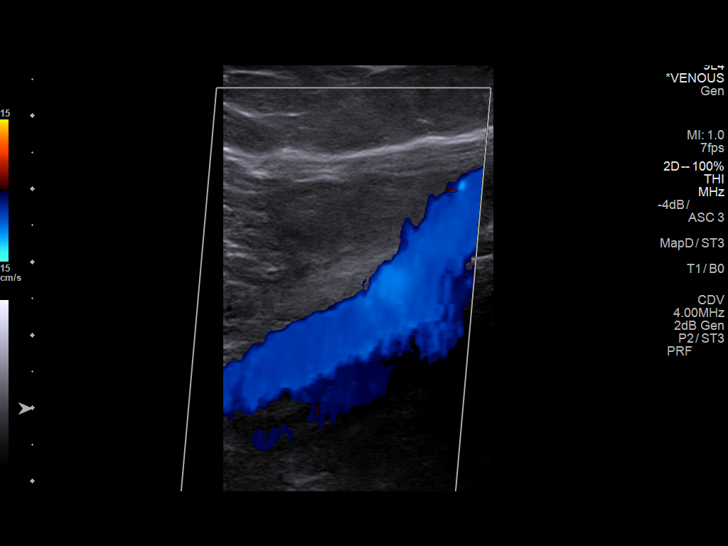
[im 23/28]
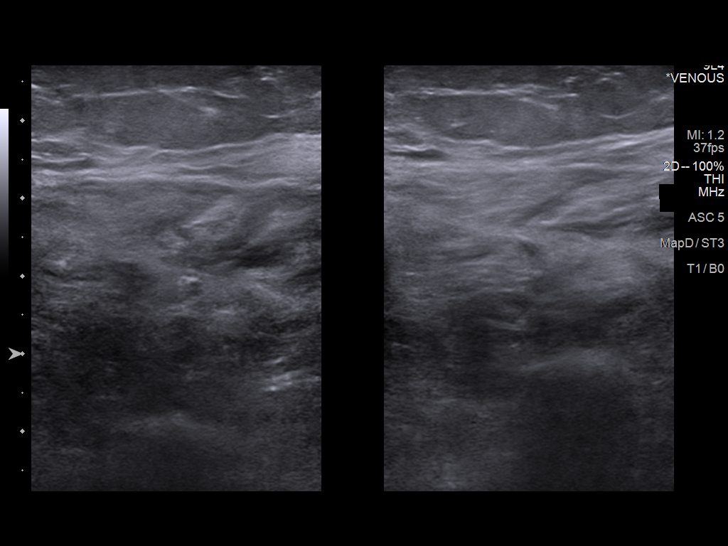
[im 25/28]
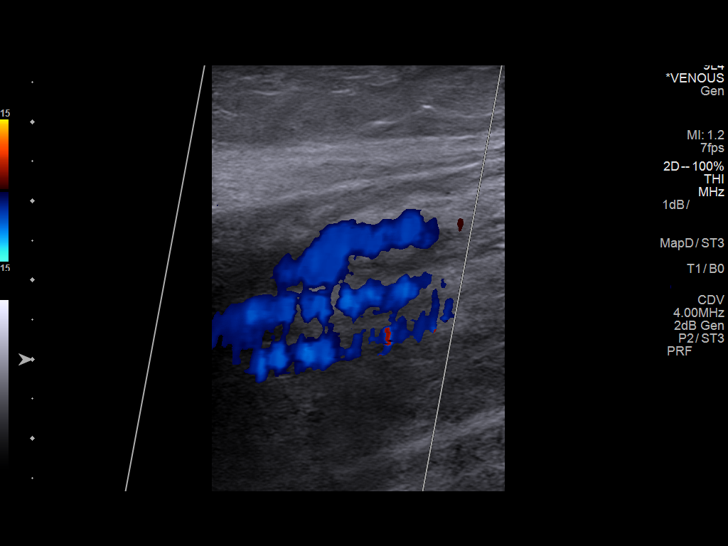
[im 28/28]
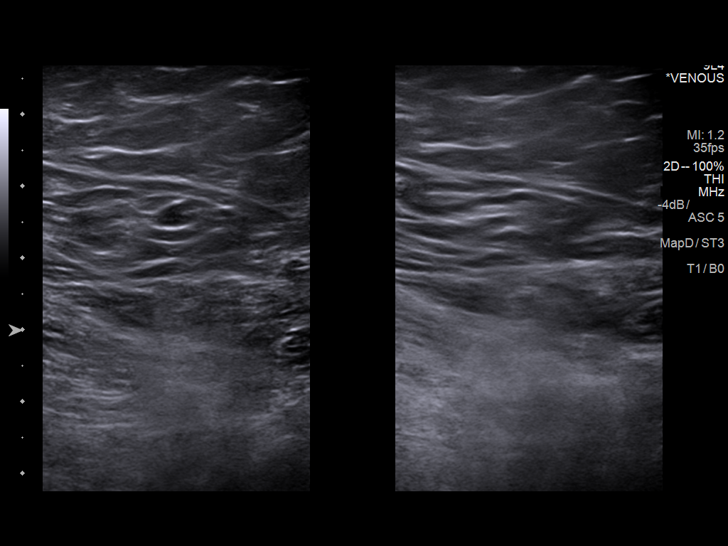

[13 of 24 positions shown; findings below may reference images not displayed]

FINDINGS: Contralateral Common Femoral Vein: Respiratory phasicity is normal
and symmetric with the symptomatic side. No evidence of thrombus.
Normal compressibility.

Common Femoral Vein: No evidence of thrombus. Normal
compressibility, respiratory phasicity and response to augmentation.

Saphenofemoral Junction: No evidence of thrombus. Normal
compressibility and flow on color Doppler imaging.

Profunda Femoral Vein: No evidence of thrombus. Normal
compressibility and flow on color Doppler imaging.

Femoral Vein: No evidence of thrombus. Normal compressibility,
respiratory phasicity and response to augmentation.

Popliteal Vein: No evidence of thrombus. Normal compressibility,
respiratory phasicity and response to augmentation.

Calf Veins: No evidence of thrombus. Normal compressibility and flow
on color Doppler imaging.

Superficial Great Saphenous Vein: No evidence of thrombus. Normal
compressibility.

Venous Reflux:  None.

Other Findings:  None.
IMPRESSION: No evidence of acute or chronic DVT within the right lower
extremity.
